# Patient Record
Sex: Male | Born: 1995 | Race: White | Marital: Single | Smoking: Never smoker
Health system: Southern US, Community
[De-identification: ages and names within clinical notes are randomized; demographics above are authoritative.]

## PROBLEM LIST (undated history)

## (undated) DIAGNOSIS — J45909 Unspecified asthma, uncomplicated: Secondary | ICD-10-CM

## (undated) HISTORY — PX: OTHER SURGICAL HISTORY: SHX169

---
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
## 2018-08-29 ENCOUNTER — Emergency Department: Payer: 59

## 2018-08-29 ENCOUNTER — Emergency Department
Admission: EM | Admit: 2018-08-29 | Discharge: 2018-08-30 | Disposition: A | Discharge status: Home / Self Care | Payer: 59 | Attending: Emergency Medicine | Admitting: Emergency Medicine

## 2018-08-29 ENCOUNTER — Encounter: Payer: Self-pay | Admitting: Emergency Medicine

## 2018-08-29 ENCOUNTER — Other Ambulatory Visit: Payer: Self-pay

## 2018-08-29 DIAGNOSIS — J039 Acute tonsillitis, unspecified: Secondary | ICD-10-CM | POA: Diagnosis not present

## 2018-08-29 DIAGNOSIS — J029 Acute pharyngitis, unspecified: Secondary | ICD-10-CM | POA: Diagnosis present

## 2018-08-29 DIAGNOSIS — R509 Fever, unspecified: Secondary | ICD-10-CM

## 2018-08-29 LAB — CBC WITH DIFFERENTIAL/PLATELET
Abs Immature Granulocytes: 0.09 10*3/uL — ABNORMAL HIGH (ref 0.00–0.07)
BASOS ABS: 0 10*3/uL (ref 0.0–0.1)
BASOS PCT: 0 %
EOS ABS: 0.1 10*3/uL (ref 0.0–0.5)
Eosinophils Relative: 0 %
HEMATOCRIT: 43.6 % (ref 39.0–52.0)
Hemoglobin: 14.3 g/dL (ref 13.0–17.0)
Immature Granulocytes: 1 %
LYMPHS ABS: 1.2 10*3/uL (ref 0.7–4.0)
Lymphocytes Relative: 8 %
MCH: 29.4 pg (ref 26.0–34.0)
MCHC: 32.8 g/dL (ref 30.0–36.0)
MCV: 89.7 fL (ref 80.0–100.0)
MONOS PCT: 15 %
Monocytes Absolute: 2.1 10*3/uL — ABNORMAL HIGH (ref 0.1–1.0)
NEUTROS ABS: 11.1 10*3/uL — AB (ref 1.7–7.7)
Neutrophils Relative %: 76 %
Platelets: 133 10*3/uL — ABNORMAL LOW (ref 150–400)
RBC: 4.86 MIL/uL (ref 4.22–5.81)
RDW: 12.2 % (ref 11.5–15.5)
WBC: 14.7 10*3/uL — ABNORMAL HIGH (ref 4.0–10.5)
nRBC: 0 % (ref 0.0–0.2)

## 2018-08-29 LAB — COMPREHENSIVE METABOLIC PANEL
ALT: 14 U/L (ref 0–44)
ANION GAP: 8 (ref 5–15)
AST: 22 U/L (ref 15–41)
Albumin: 4.1 g/dL (ref 3.5–5.0)
Alkaline Phosphatase: 41 U/L (ref 38–126)
BUN: 13 mg/dL (ref 6–20)
CALCIUM: 8.7 mg/dL — AB (ref 8.9–10.3)
CO2: 28 mmol/L (ref 22–32)
CREATININE: 1.29 mg/dL — AB (ref 0.61–1.24)
Chloride: 101 mmol/L (ref 98–111)
Glucose, Bld: 97 mg/dL (ref 70–99)
POTASSIUM: 3.8 mmol/L (ref 3.5–5.1)
Sodium: 137 mmol/L (ref 135–145)
TOTAL PROTEIN: 7.3 g/dL (ref 6.5–8.1)
Total Bilirubin: 1 mg/dL (ref 0.3–1.2)

## 2018-08-29 LAB — GROUP A STREP BY PCR: GROUP A STREP BY PCR: NOT DETECTED

## 2018-08-29 LAB — INFLUENZA PANEL BY PCR (TYPE A & B)
INFLBPCR: NEGATIVE
Influenza A By PCR: NEGATIVE

## 2018-08-29 NOTE — ED Triage Notes (Signed)
Pt seen at [facility], fever, pressure in ears, headache, strep negative at health services

## 2018-08-29 NOTE — ED Notes (Signed)
Pt triaged by Lynn ItoSherie Antinette Keough not Kathyrn SheriffLisa Thompson.

## 2018-08-29 NOTE — ED Provider Notes (Signed)
[facility] Emergency Department Provider Note  ____________________________________________  Time seen: Approximately 10:37 PM  I have reviewed the triage vital signs and the nursing notes.   HISTORY  Chief Complaint Sore Throat   HPI Johnny Weaver is a 22 y.o. male who presents to the emergency department for treatment and evaluation of fever, pressure in his ears, headache, sore throat.  [facility] and had a negative strep screen, but states that blood work showed a bacterial infection.  Someone called him in a prescription for Ceftin today which he has taken.  He has had no relief.  Patient traveled to [place] last month.    Past Medical History:  Diagnosis Date  . Asthma     Patient Active Problem List   Diagnosis Date Noted  . Orchitis 06/13/2016    Past Surgical History:  Procedure Laterality Date  . none      Prior to Admission medications   Medication Sig Start Date End Date Taking? Authorizing Provider  Adapalene 0.3 % gel Apply topically. 12/24/15   [provider]  albuterol (PROVENTIL HFA;VENTOLIN HFA) 108 (90 Base) MCG/ACT inhaler Inhale into the lungs.    [provider]  penicillin v potassium (VEETID) 500 MG tablet Take 500 mg by mouth 4 (four) times daily.    [provider]    Allergies Patient has no known allergies.  Family History  Problem Relation Age of Onset  . Prostate cancer Neg Hx   . Bladder Cancer Neg Hx   . Kidney cancer Neg Hx     Social History Social History   Tobacco Use  . Smoking status: Never Smoker  . Smokeless tobacco: Never Used  Substance Use Topics  . Alcohol use: Yes    Alcohol/week: 5.0 standard drinks    Types: 5 Cans of beer per week  . Drug use: Yes    Types: Marijuana    Review of Systems Constitutional: Positive for fever/chills. Decreased appetite. ENT: Positive for sore throat. Cardiovascular: Denies chest  pain. Respiratory: Negative for shortness of breath. Positive for cough. Negative for wheezing.  Gastrointestinal: Negative for nausea,  no vomiting.  Negative for diarrhea.  Musculoskeletal: Positive for body aches Skin: Negative for rash. Neurological: Positive for headaches ____________________________________________   PHYSICAL EXAM:  VITAL SIGNS: ED Triage Vitals  Enc Vitals Group     BP 08/29/18 2151 (!) 149/71     Pulse Rate 08/29/18 2151 95     Resp 08/29/18 2151 18     Temp 08/29/18 2151 99.8 F (37.7 C)     Temp Source 08/29/18 2151 Oral     SpO2 08/29/18 2151 100 %     Weight 08/29/18 2152 195 lb (88.5 kg)     Height 08/29/18 2152 5\' 11"  (1.803 m)     Head Circumference --      Peak Flow --      Pain Score 08/29/18 2151 7     Pain Loc --      Pain Edu? --      Excl. in GC? --     Constitutional: Alert and oriented. Acutely ill appearing and in no acute distress. Eyes: Conjunctivae are normal. Ears: Bilateral TM injected and mildly erythematous Nose: Maxillary sinus congestion noted; no rhinnorhea. Mouth/Throat: Mucous membranes are moist.  Oropharynx erythematous. Tonsils without exudate. Uvula midline. Neck: No stridor.  Lymphatic: Palpable anterior cervical lymphadenopathy. Cardiovascular: Normal rate, regular rhythm. Good peripheral circulation. Respiratory: Respirations are  even and unlabored.  No retractions. Breath sounds clear to auscultation. Gastrointestinal: Soft and nontender.  Musculoskeletal: FROM x 4 extremities.  Neurologic:  Normal speech and language. Skin:  Skin is warm, dry and intact. No rash noted. Psychiatric: Mood and affect are normal. Speech and behavior are normal.  ____________________________________________   LABS (all labs ordered are listed, but only abnormal results are displayed)  Labs Reviewed  GROUP A STREP BY PCR  CULTURE, BLOOD (ROUTINE X 2)  CULTURE, BLOOD (ROUTINE X 2)  COMPREHENSIVE METABOLIC PANEL  INFLUENZA  PANEL BY PCR (TYPE A & B)  CBC WITH DIFFERENTIAL/PLATELET   ____________________________________________  EKG  Not indicated. ____________________________________________  RADIOLOGY  Not indicated. ____________________________________________   PROCEDURES  Procedure(s) performed: None  Critical Care performed: No ____________________________________________   INITIAL IMPRESSION / ASSESSMENT AND PLAN / ED COURSE  22 y.o. male who presents to the emergency department for treatment and evaluation of fever, earache, headache, sore throat.  Because he has traveled internationally within the past couple months labs as well as strep and influenza testing will be completed.  Patient is agreeable to the plan.   ----------------------------------------- 12:02 AM on 08/30/2018 -----------------------------------------  This department is closing for the night. Patient will be moved to room 1. Mono is still pending.     Medications - No data to display  ED Discharge Orders    None       Pertinent labs & imaging results that were available during my care of the patient were reviewed by me and considered in my medical decision making (see chart for details).    If controlled substance prescribed during this visit, 12 month history viewed on the [facility] prior to issuing an initial prescription for Schedule II or III opiod. ____________________________________________   FINAL CLINICAL IMPRESSION(S) / ED DIAGNOSES  Final diagnoses:  None    Note:  This document was prepared using Dragon voice recognition software and may include unintentional dictation errors.     Chinita Pester, FNP 08/30/18 0008    Minna Antis, MD 09/04/18 2227

## 2018-08-29 NOTE — ED Notes (Signed)
Pt seen at health services, rapid strep negative, blood work showed bacterial infection, started on ceftin today

## 2018-08-30 LAB — MONONUCLEOSIS SCREEN: MONO SCREEN: NEGATIVE

## 2018-08-30 MED ORDER — DEXAMETHASONE SODIUM PHOSPHATE 10 MG/ML IJ SOLN
10.0000 mg | Freq: Once | INTRAMUSCULAR | Status: AC
  Administered 2018-08-30: 10 mg via INTRAVENOUS
  Filled 2018-08-30: qty 1

## 2018-08-30 MED ORDER — MAGIC MOUTHWASH: 5.0000 mL | Freq: Three times a day (TID) | ORAL | 0 refills | Status: DC | PRN

## 2018-08-30 MED ORDER — SODIUM CHLORIDE 0.9 % IV SOLN
INTRAVENOUS | Status: AC
  Filled 2018-08-30: qty 3

## 2018-08-30 MED ORDER — KETOROLAC TROMETHAMINE 30 MG/ML IJ SOLN
15.0000 mg | Freq: Once | INTRAMUSCULAR | Status: AC
  Administered 2018-08-30: 15 mg via INTRAVENOUS
  Filled 2018-08-30: qty 1

## 2018-08-30 MED ORDER — SODIUM CHLORIDE 0.9 % IV BOLUS
1000.0000 mL | Freq: Once | INTRAVENOUS | Status: AC
  Administered 2018-08-30: 1000 mL via INTRAVENOUS

## 2018-08-30 MED ORDER — MAGIC MOUTHWASH
10.0000 mL | Freq: Once | ORAL | Status: AC
  Administered 2018-08-30: 10 mL via ORAL
  Filled 2018-08-30: qty 10

## 2018-08-30 NOTE — Discharge Instructions (Addendum)
1.  Alternate Tylenol and ibuprofen every 4 hours as needed for fever greater than 100.4 F. 2.  Take antibiotic as already prescribed to you. 3.  You may use Magic mouthwash as needed for throat discomfort. 4.  Drink plenty of fluids daily. 5.  Return to the ER for worsening symptoms, persistent vomiting, difficulty breathing or other concerns.

## 2018-08-30 NOTE — ED Provider Notes (Signed)
-----------------------------------------   12:17 AM on 08/30/2018 -----------------------------------------  Assumed care of this patient from NP Triplett.  Awaiting results of Monospot.  Examination of oropharynx reveals symmetrically swollen tonsils with exudates, no peritonsillar abscess.  There is no hoarse or muffled voice.  There is no drooling.  Will initiate IV fluid resuscitation, IV Decadron, IV Toradol for discomfort, Magic mouthwash for throat pain.  IV Unasyn for tonsillitis.   ----------------------------------------- 2:53 AM on 08/30/2018 -----------------------------------------  Delay secondary to computer downtime.  Monospot returned negative.  Patient is feeling significantly better after IV Toradol, fluids and antibiotic.  Will discharge home with Magic mouthwash as needed.  Patient already has prescription for Ceftin.  Encouraged follow-up with ENT.  Strict return precautions given.  Patient verbalizes understanding and agrees with plan of care.   Irean HongSung, Dontae Minerva J, MD 08/30/18 [phone]

## 2018-09-03 LAB — CULTURE, BLOOD (ROUTINE X 2)
CULTURE: NO GROWTH
Culture: NO GROWTH
SPECIAL REQUESTS: ADEQUATE
Special Requests: ADEQUATE

## 2019-08-01 ENCOUNTER — Emergency Department
Admission: EM | Admit: 2019-08-01 | Discharge: 2019-08-01 | Disposition: A | Discharge status: Home / Self Care | Payer: 59 | Attending: Emergency Medicine | Admitting: Emergency Medicine

## 2019-08-01 ENCOUNTER — Encounter: Payer: Self-pay | Admitting: Emergency Medicine

## 2019-08-01 ENCOUNTER — Other Ambulatory Visit: Payer: Self-pay

## 2019-08-01 ENCOUNTER — Emergency Department: Payer: 59

## 2019-08-01 DIAGNOSIS — Y9389 Activity, other specified: Secondary | ICD-10-CM | POA: Diagnosis not present

## 2019-08-01 DIAGNOSIS — S199XXA Unspecified injury of neck, initial encounter: Secondary | ICD-10-CM | POA: Diagnosis present

## 2019-08-01 DIAGNOSIS — Y92410 Unspecified street and highway as the place of occurrence of the external cause: Secondary | ICD-10-CM | POA: Diagnosis not present

## 2019-08-01 DIAGNOSIS — S60222A Contusion of left hand, initial encounter: Secondary | ICD-10-CM | POA: Diagnosis not present

## 2019-08-01 DIAGNOSIS — M7918 Myalgia, other site: Secondary | ICD-10-CM

## 2019-08-01 DIAGNOSIS — J45909 Unspecified asthma, uncomplicated: Secondary | ICD-10-CM | POA: Insufficient documentation

## 2019-08-01 DIAGNOSIS — Z79899 Other long term (current) drug therapy: Secondary | ICD-10-CM | POA: Insufficient documentation

## 2019-08-01 DIAGNOSIS — R519 Headache, unspecified: Secondary | ICD-10-CM | POA: Insufficient documentation

## 2019-08-01 DIAGNOSIS — M25512 Pain in left shoulder: Secondary | ICD-10-CM | POA: Diagnosis not present

## 2019-08-01 DIAGNOSIS — S161XXA Strain of muscle, fascia and tendon at neck level, initial encounter: Secondary | ICD-10-CM | POA: Diagnosis not present

## 2019-08-01 DIAGNOSIS — Y999 Unspecified external cause status: Secondary | ICD-10-CM | POA: Insufficient documentation

## 2019-08-01 MED ORDER — TRAMADOL HCL 50 MG PO TABS: 50.0000 mg | ORAL_TABLET | Freq: Four times a day (QID) | ORAL | 0 refills | Status: AC | PRN

## 2019-08-01 MED ORDER — CYCLOBENZAPRINE HCL 10 MG PO TABS: 10.0000 mg | ORAL_TABLET | Freq: Three times a day (TID) | ORAL | 0 refills | Status: AC | PRN

## 2019-08-01 MED ORDER — IBUPROFEN 600 MG PO TABS: 600.0000 mg | ORAL_TABLET | Freq: Three times a day (TID) | ORAL | 0 refills | Status: AC | PRN
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
## 2019-08-01 NOTE — Discharge Instructions (Signed)
Follow discharge care instruction and use Ace wrap to left hand for 2 to 3 days.  Be advised medication may cause drowsiness.
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
